# Patient Record
Sex: Female | Born: 1950 | Race: Black or African American | Hispanic: No | Smoking: Never smoker
Health system: Southern US, Community
[De-identification: ages and names within clinical notes are randomized; demographics above are authoritative.]

## PROBLEM LIST (undated history)

## (undated) DIAGNOSIS — I4891 Unspecified atrial fibrillation: Secondary | ICD-10-CM

## (undated) DIAGNOSIS — M1990 Unspecified osteoarthritis, unspecified site: Secondary | ICD-10-CM

## (undated) DIAGNOSIS — I10 Essential (primary) hypertension: Secondary | ICD-10-CM

## (undated) HISTORY — PX: TUBAL LIGATION: SHX77

## (undated) HISTORY — PX: CARDIAC SURGERY: SHX584

---

## 2018-07-12 ENCOUNTER — Emergency Department (HOSPITAL_COMMUNITY): Payer: Medicare Other

## 2018-07-12 ENCOUNTER — Encounter (HOSPITAL_COMMUNITY): Payer: Self-pay | Admitting: Emergency Medicine

## 2018-07-12 ENCOUNTER — Emergency Department (HOSPITAL_COMMUNITY)
Admission: EM | Admit: 2018-07-12 | Discharge: 2018-07-12 | Disposition: A | Discharge status: Home / Self Care | Payer: Medicare Other | Attending: Emergency Medicine | Admitting: Emergency Medicine

## 2018-07-12 DIAGNOSIS — M25561 Pain in right knee: Secondary | ICD-10-CM

## 2018-07-12 DIAGNOSIS — I10 Essential (primary) hypertension: Secondary | ICD-10-CM | POA: Diagnosis not present

## 2018-07-12 HISTORY — DX: Essential (primary) hypertension: I10

## 2018-07-12 HISTORY — DX: Unspecified osteoarthritis, unspecified site: M19.90

## 2018-07-12 HISTORY — DX: Unspecified atrial fibrillation: I48.91

## 2018-07-12 MED ORDER — IBUPROFEN 600 MG PO TABS: 600.0000 mg | ORAL_TABLET | Freq: Four times a day (QID) | ORAL | 0 refills | Status: AC | PRN

## 2018-07-12 MED ORDER — CYCLOBENZAPRINE HCL 10 MG PO TABS: 10.0000 mg | ORAL_TABLET | Freq: Two times a day (BID) | ORAL | 0 refills | Status: AC | PRN

## 2018-07-12 MED ORDER — KETOROLAC TROMETHAMINE 30 MG/ML IJ SOLN
30.0000 mg | Freq: Once | INTRAMUSCULAR | Status: AC
  Administered 2018-07-12: 30 mg via INTRAMUSCULAR
  Filled 2018-07-12: qty 1

## 2018-07-12 NOTE — ED Provider Notes (Signed)
[facility] EMERGENCY DEPARTMENT Provider Note   CSN: [id number] Arrival date & time: 07/12/18  0401     History   Chief Complaint Chief Complaint  Patient presents with  . Knee Pain    HPI Joann Turner is a 68 y.o. female.  The history is provided by the patient and medical records. No language interpreter was used.  Knee Pain  Associated symptoms: no fever      68 year old female with history of atrial fibrillation, arthritis, hypertension, presenting complaining of right knee pain.  Patient mention having pain to the medial aspect of her right knee ongoing for the past several months.  However yesterday while she was about to walk up the steps, she felt a pop with worsening pain to the affected area and she has been unable to bear weight.  Pain is 6 out of 10 at resting, and 10 out of 10 with walking.  Pain is nonradiating.  No complaints of hip or ankle pain.  She denies any specific treatment tried.  She does have history of A. fib but have not been compliant with her Eliquis.  She does not complain of any calf tenderness, focal numbness or weakness.  She does have an orthopedic doctor in which she will regularly received cortisone shots to her knee but states she has not been seen by him for the past 2 to 3 months.  Past Medical History:  Diagnosis Date  . A-fib (HCC)   . Arthritis   . Hypertension     There are no active problems to display for this patient.   Past Surgical History:  Procedure Laterality Date  . CARDIAC SURGERY     Heart Cath, no stents  . TUBAL LIGATION       OB History   No obstetric history on file.      Home Medications    Prior to Admission medications   Not on File    Family History No family history on file.  Social History Social History   Tobacco Use  . Smoking status: Never Smoker  . Smokeless tobacco: Never Used  Substance Use Topics  . Alcohol use: Yes    Comment: Socially   . Drug use: Not  Currently     Allergies   Morphine and related   Review of Systems Review of Systems  Constitutional: Negative for fever.  Musculoskeletal: Positive for arthralgias.  Skin: Negative for rash and wound.  Neurological: Negative for numbness.     Physical Exam Updated Vital Signs BP (!) 163/89 (BP Location: Right Arm)   Pulse 70   Temp 98.7 F (37.1 C) (Oral)   Resp 18   Ht 5\' 5"  (1.651 m)   Wt 104.3 kg   SpO2 97%   BMI 38.27 kg/m   Physical Exam Vitals signs and nursing note reviewed.  Constitutional:      General: She is not in acute distress.    Appearance: She is well-developed. She is obese.  HENT:     Head: Atraumatic.  Eyes:     Conjunctiva/sclera: Conjunctivae normal.  Neck:     Musculoskeletal: Neck supple.  Musculoskeletal:        General: Tenderness (Right knee: Tenderness to medial joint line on palpation.  Increasing pain with knee flexion.  No obvious joint instability or laxity, no swelling or rash.) present.     Comments: No tenderness to right hip or right ankle.  No calf tenderness.  Skin:    Findings:  No rash.  Neurological:     Mental Status: She is alert.      ED Treatments / Results  Labs (all labs ordered are listed, but only abnormal results are displayed) Labs Reviewed - No data to display  EKG None  Radiology Dg Knee Complete 4 Views Right  Result Date: 07/12/2018 CLINICAL DATA:  Right knee pain since last night. EXAM: RIGHT KNEE - COMPLETE 4+ VIEW COMPARISON:  None. FINDINGS: No evidence of fracture, dislocation, or joint effusion. Mild degenerative marginal spurring mainly at the medial compartment. IMPRESSION: 1. No acute finding. 2. Mild medial compartment degenerative spurring. Electronically Signed   By: Marnee Spring M.D.   On: 07/12/2018 04:47    Procedures Procedures (including critical care time)  Medications Ordered in ED Medications - No data to display   Initial Impression / Assessment and Plan / ED Course    I have reviewed the triage vital signs and the nursing notes.  Pertinent labs & imaging results that were available during my care of the patient were reviewed by me and considered in my medical decision making (see chart for details).     BP (!) 163/89 (BP Location: Right Arm)   Pulse 70   Temp 98.7 F (37.1 C) (Oral)   Resp 18   Ht 5\' 5"  (1.651 m)   Wt 104.3 kg   SpO2 97%   BMI 38.27 kg/m    Final Clinical Impressions(s) / ED Diagnoses   Final diagnoses:  Acute pain of right knee    ED Discharge Orders         Ordered    ibuprofen (ADVIL,MOTRIN) 600 MG tablet  Every 6 hours PRN     07/12/18 0759    cyclobenzaprine (FLEXERIL) 10 MG tablet  2 times daily PRN     07/12/18 0759         7:39 AM Patient here with acute on chronic right knee pain primarily at the medial joint line.  Suspect meniscal injury versus ligamentous injury versus strain/sprain.  Low suspicion for septic joint or DVT.  No evidence to suggest cellulitis.  Will provide Ace wrap, crutches, rice therapy, and outpatient follow-up.   Fayrene Helper, PA-C 07/12/18 0800    Arby Barrette, MD 07/13/18 [phone]

## 2018-07-12 NOTE — Discharge Instructions (Signed)
Wear ACE wrap, use crutches, keep leg elevated while resting and follow up closely with your orthopedist for further care of your right knee.

## 2018-07-12 NOTE — ED Triage Notes (Signed)
Pt reports R knee pain onset last night. States hx of arthritis, but heard her knee "pop" when she was going down the stairs. No deformity noted.

## 2019-06-29 DEATH — deceased

## 2020-01-11 IMAGING — CR DG KNEE COMPLETE 4+V*R*
4 series · 4 of 4 positions shown · non-contrast
Comparison: None.

CLINICAL DATA: Right knee pain since last night.

EXAM:
RIGHT KNEE - COMPLETE 4+ VIEW

[knee ap]
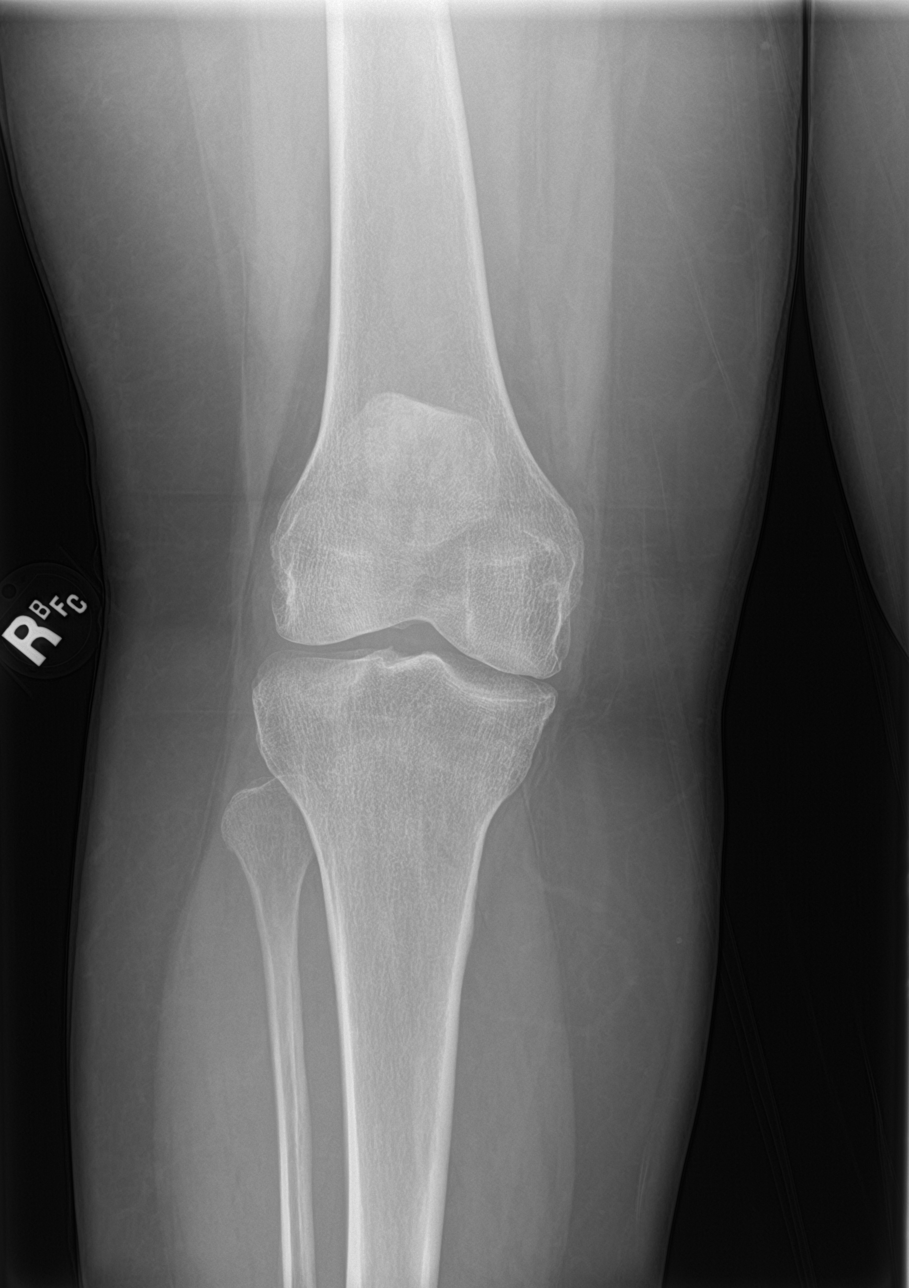

[knee lat]
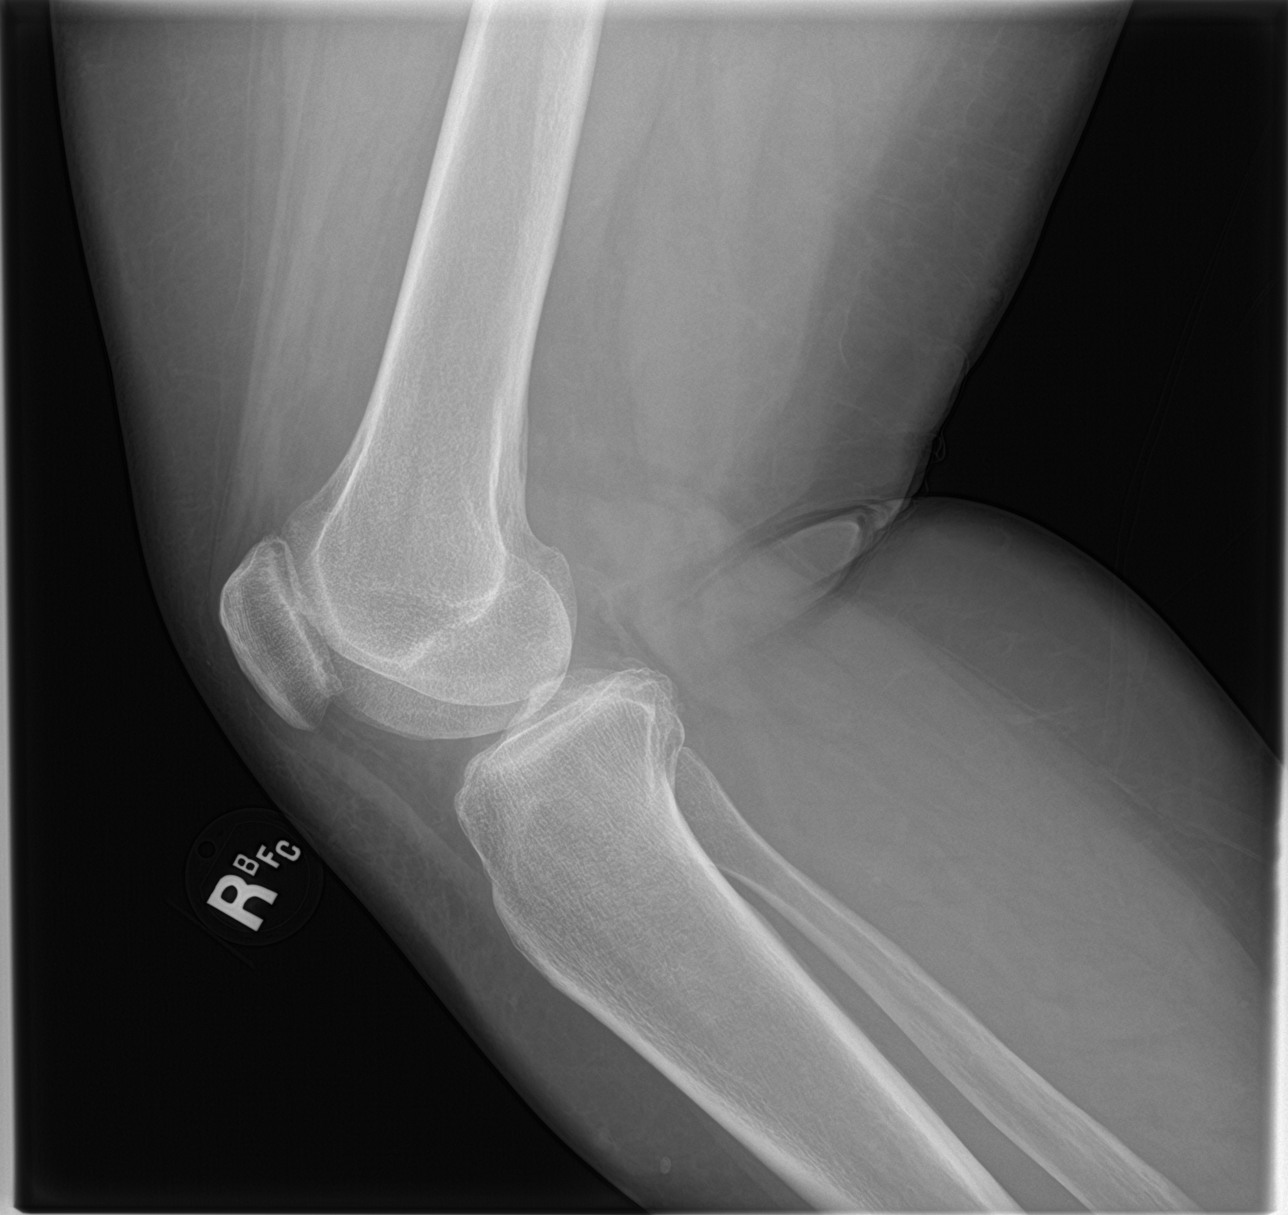

[knee obl (1 of 2)]
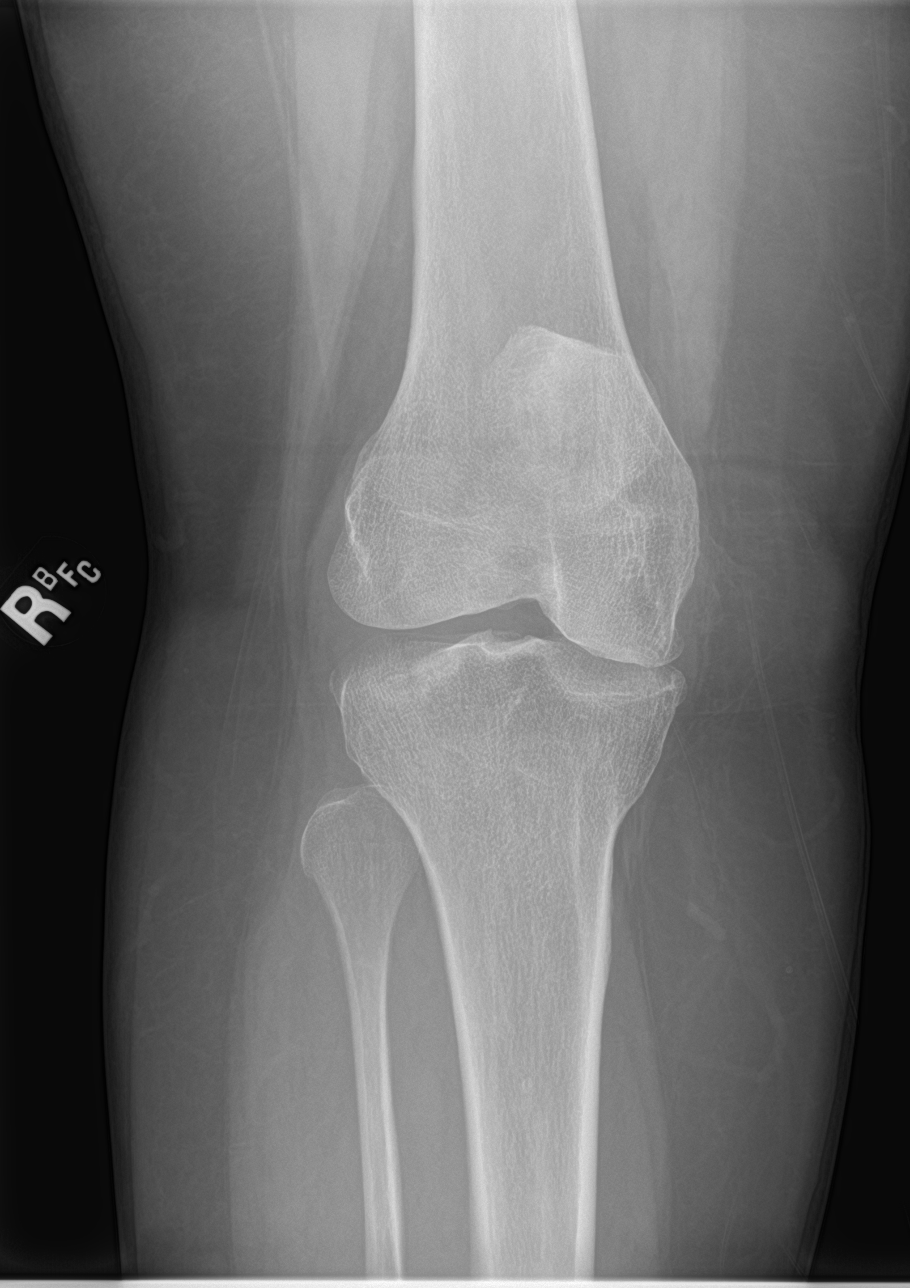

[knee obl (2 of 2)]
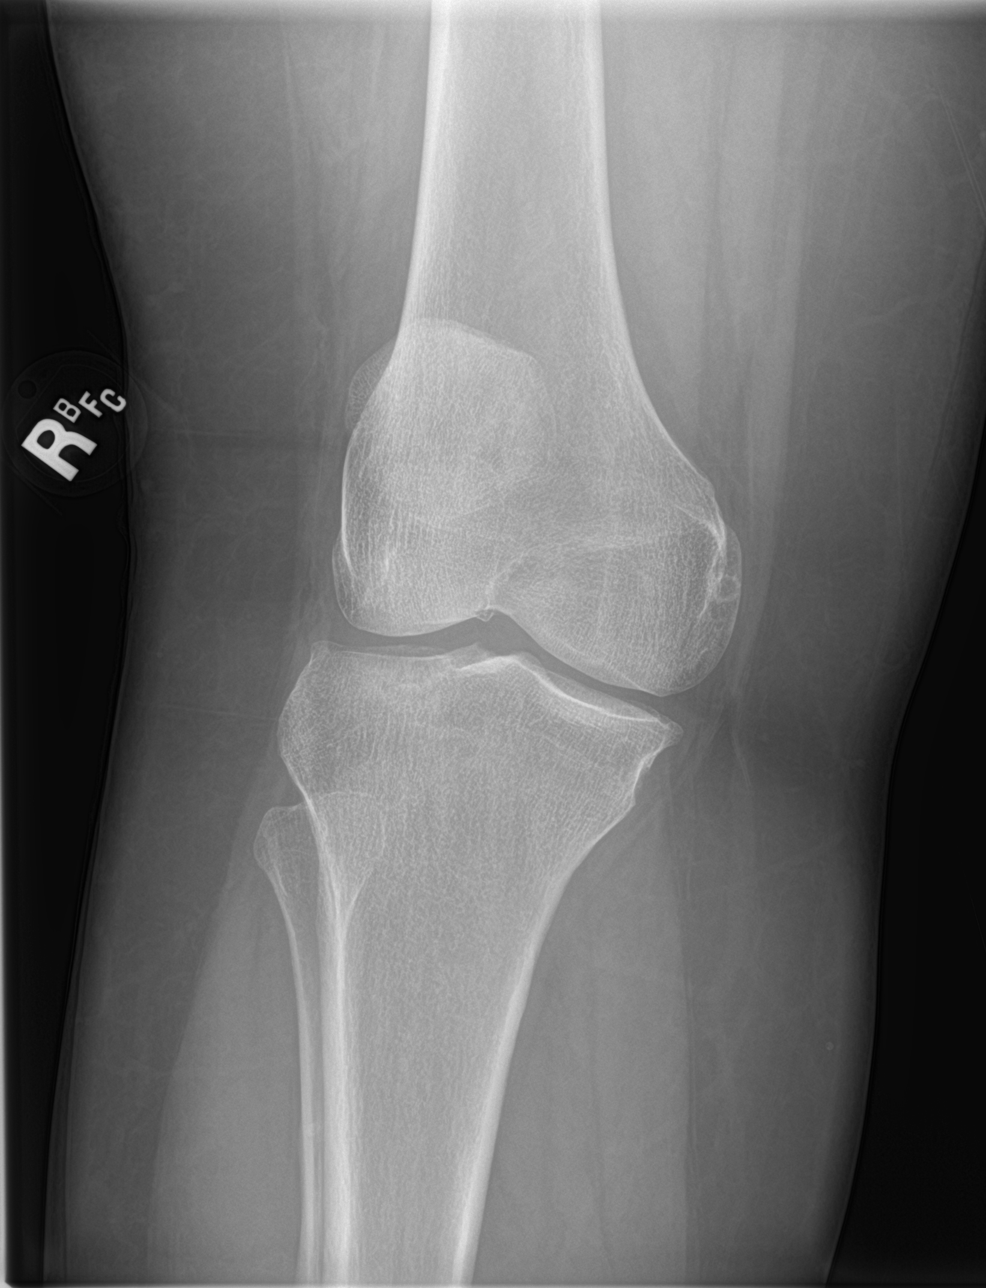

[4 of 4 positions shown; findings below may reference images not displayed]

FINDINGS: No evidence of fracture, dislocation, or joint effusion. Mild
degenerative marginal spurring mainly at the medial compartment.
IMPRESSION: 1. No acute finding.
2. Mild medial compartment degenerative spurring.
